# Patient Record
Sex: Female | Born: 1980 | Race: White | Hispanic: Yes | Marital: Married | Smoking: Never smoker
Health system: Southern US, Community
[De-identification: ages and names within clinical notes are randomized; demographics above are authoritative.]

## PROBLEM LIST (undated history)

## (undated) DIAGNOSIS — N649 Disorder of breast, unspecified: Secondary | ICD-10-CM

## (undated) HISTORY — DX: Disorder of breast, unspecified: N64.9

---

## 2002-02-14 ENCOUNTER — Ambulatory Visit (HOSPITAL_COMMUNITY): Admission: RE | Admit: 2002-02-14 | Discharge: 2002-02-14 | Payer: Self-pay | Admitting: *Deleted

## 2002-05-29 ENCOUNTER — Encounter (HOSPITAL_COMMUNITY): Admission: AD | Admit: 2002-05-29 | Discharge: 2002-05-29 | Payer: Self-pay | Admitting: *Deleted

## 2002-05-29 ENCOUNTER — Inpatient Hospital Stay (HOSPITAL_COMMUNITY): Admission: AD | Admit: 2002-05-29 | Discharge: 2002-06-02 | Payer: Self-pay | Admitting: Obstetrics and Gynecology

## 2007-05-06 ENCOUNTER — Ambulatory Visit (HOSPITAL_COMMUNITY): Admission: RE | Admit: 2007-05-06 | Discharge: 2007-05-06 | Payer: Self-pay | Admitting: Obstetrics & Gynecology

## 2007-07-01 ENCOUNTER — Ambulatory Visit (HOSPITAL_COMMUNITY): Admission: RE | Admit: 2007-07-01 | Discharge: 2007-07-01 | Payer: Self-pay | Admitting: Obstetrics & Gynecology

## 2007-08-26 ENCOUNTER — Ambulatory Visit (HOSPITAL_COMMUNITY): Admission: RE | Admit: 2007-08-26 | Discharge: 2007-08-26 | Payer: Self-pay | Admitting: Obstetrics & Gynecology

## 2007-09-09 ENCOUNTER — Inpatient Hospital Stay (HOSPITAL_COMMUNITY): Admission: AD | Admit: 2007-09-09 | Discharge: 2007-09-11 | Payer: Self-pay | Admitting: Family Medicine

## 2007-09-09 ENCOUNTER — Ambulatory Visit: Payer: Self-pay | Admitting: Obstetrics and Gynecology

## 2011-02-10 LAB — CBC
HCT: 28.1 — ABNORMAL LOW
HCT: 35.4 — ABNORMAL LOW
Hemoglobin: 12.2
Hemoglobin: 9.8 — ABNORMAL LOW
MCHC: 34.5
MCHC: 34.7
MCV: 85
MCV: 85.5
Platelets: 268
RBC: 4.14
RDW: 13.9
WBC: 12 — ABNORMAL HIGH

## 2011-02-10 LAB — RPR: RPR Ser Ql: NONREACTIVE

## 2011-10-15 ENCOUNTER — Other Ambulatory Visit: Payer: Self-pay | Admitting: Obstetrics and Gynecology

## 2011-10-15 DIAGNOSIS — N63 Unspecified lump in unspecified breast: Secondary | ICD-10-CM

## 2011-10-16 ENCOUNTER — Encounter: Payer: Self-pay | Admitting: *Deleted

## 2011-10-16 ENCOUNTER — Ambulatory Visit (INDEPENDENT_AMBULATORY_CARE_PROVIDER_SITE_OTHER): Payer: Self-pay | Admitting: *Deleted

## 2011-10-16 VITALS — BP 118/62 | HR 76 | Temp 98.7°F | Ht 61.5 in | Wt 129.9 lb

## 2011-10-16 DIAGNOSIS — N63 Unspecified lump in unspecified breast: Secondary | ICD-10-CM

## 2011-10-16 DIAGNOSIS — Z1239 Encounter for other screening for malignant neoplasm of breast: Secondary | ICD-10-CM

## 2011-10-16 DIAGNOSIS — N6321 Unspecified lump in the left breast, upper outer quadrant: Secondary | ICD-10-CM

## 2011-10-16 NOTE — Patient Instructions (Signed)
Taught patient how to perform BSE. Patient did not need a Pap smear today due to last Pap smear was 07/03/11. Let her know BCCCP will cover Pap smears every 3 years unless has a history of abnormal Pap smears. Patient referred to the [facility] for left breast Diagnostic Mammogram and possible left breast ultrasound. Appointment scheduled for Thursday, October 22, 2011 at 1510. Patient aware of appointment and will be there. Let patient know will follow up with her within the next couple weeks with results. Patient verbalized understanding.

## 2011-10-16 NOTE — Progress Notes (Signed)
Complaints of left breast lump x 1 month. Referred by the [facility] Department 10/07/11.  Pap Smear:    Pap smear not performed today. Patients last Pap smear was 07/03/11 at [facility] Department and was normal with fungal organisms consistent with candida. Per patient she has no history of abnormal Pap smears. No Pap smear results in EPIC.  Physical exam: Breasts Breasts symmetrical. No skin abnormalities bilateral breasts. No nipple retraction bilateral breasts. No nipple discharge bilateral breasts. No lymphadenopathy. No lumps palpated right breast. Palpated lump in the left breast at 1 o'clock proximal to the nipple measuring 2 cm x 2 cm. No complaints of pain or tenderness on exam. Patient referred to the [facility] for left breast Diagnostic Mammogram and possible left breast ultrasound. Appointment scheduled for Thursday, October 22, 2011 at 1510.        Pelvic/Bimanual No Pap smear completed today since last Pap smear was 07/03/11 and normal. Pap smear not indicated per BCCCP guidelines.

## 2011-10-22 ENCOUNTER — Ambulatory Visit
Admission: RE | Admit: 2011-10-22 | Discharge: 2011-10-22 | Disposition: A | Discharge status: Home / Self Care | Payer: No Typology Code available for payment source | Source: Ambulatory Visit | Attending: Obstetrics and Gynecology | Admitting: Obstetrics and Gynecology

## 2011-10-22 ENCOUNTER — Other Ambulatory Visit: Payer: Self-pay | Admitting: Obstetrics and Gynecology

## 2011-10-22 DIAGNOSIS — N63 Unspecified lump in unspecified breast: Secondary | ICD-10-CM

## 2011-11-05 ENCOUNTER — Ambulatory Visit
Admission: RE | Admit: 2011-11-05 | Discharge: 2011-11-05 | Disposition: A | Discharge status: Home / Self Care | Payer: No Typology Code available for payment source | Source: Ambulatory Visit | Attending: Obstetrics and Gynecology | Admitting: Obstetrics and Gynecology

## 2011-11-05 ENCOUNTER — Other Ambulatory Visit: Payer: Self-pay | Admitting: Obstetrics and Gynecology

## 2011-11-05 DIAGNOSIS — N63 Unspecified lump in unspecified breast: Secondary | ICD-10-CM

## 2011-11-06 ENCOUNTER — Ambulatory Visit
Admission: RE | Admit: 2011-11-06 | Discharge: 2011-11-06 | Disposition: A | Discharge status: Home / Self Care | Payer: No Typology Code available for payment source | Source: Ambulatory Visit | Attending: Obstetrics and Gynecology | Admitting: Obstetrics and Gynecology

## 2011-11-06 DIAGNOSIS — N63 Unspecified lump in unspecified breast: Secondary | ICD-10-CM

## 2011-11-06 NOTE — Progress Notes (Signed)
Interpreter Wyvonnia Dusky for Biopsy results Breast Center

## 2014-03-19 ENCOUNTER — Encounter: Payer: Self-pay | Admitting: *Deleted

## 2017-04-19 ENCOUNTER — Encounter (HOSPITAL_COMMUNITY): Payer: Self-pay

## 2020-01-18 ENCOUNTER — Encounter: Payer: Self-pay | Admitting: Obstetrics and Gynecology

## 2020-01-18 ENCOUNTER — Other Ambulatory Visit (HOSPITAL_COMMUNITY)
Admission: RE | Admit: 2020-01-18 | Discharge: 2020-01-18 | Disposition: A | Discharge status: Home / Self Care | Payer: Self-pay | Source: Ambulatory Visit | Attending: Obstetrics and Gynecology | Admitting: Obstetrics and Gynecology

## 2020-01-18 ENCOUNTER — Other Ambulatory Visit: Payer: Self-pay

## 2020-01-18 ENCOUNTER — Ambulatory Visit (INDEPENDENT_AMBULATORY_CARE_PROVIDER_SITE_OTHER): Payer: Self-pay | Admitting: Obstetrics and Gynecology

## 2020-01-18 ENCOUNTER — Ambulatory Visit: Payer: Self-pay | Admitting: *Deleted

## 2020-01-18 VITALS — BP 132/82 | Wt 147.8 lb

## 2020-01-18 DIAGNOSIS — R87610 Atypical squamous cells of undetermined significance on cytologic smear of cervix (ASC-US): Secondary | ICD-10-CM

## 2020-01-18 DIAGNOSIS — Z1239 Encounter for other screening for malignant neoplasm of breast: Secondary | ICD-10-CM

## 2020-01-18 DIAGNOSIS — R87810 Cervical high risk human papillomavirus (HPV) DNA test positive: Secondary | ICD-10-CM | POA: Insufficient documentation

## 2020-01-18 DIAGNOSIS — Z3202 Encounter for pregnancy test, result negative: Secondary | ICD-10-CM

## 2020-01-18 LAB — POCT PREGNANCY, URINE: Preg Test, Ur: NEGATIVE

## 2020-01-18 NOTE — Patient Instructions (Signed)
Explained breast self awareness with Ashley Moody. Patient did not need a Pap smear today due to last Pap smear was 11/22/2019. Explained the colposcopy the recommended follow-up for her abnormal Pap smear. Referred patient to the [facility] for [facility] for a colposcopy. Appointment scheduled Thursday, January 18, 2020 at 0935. Patient aware of appointment and will be there. Let patient know her screening mammogram is due in April 2022 after she turns 40. Told patient to give [place] a call prior to April and we will schedule her mammogram on the mobile unit. Explained to patient that BCCCP will cover the mammogram. [name] verbalized understanding.  Annmargaret Decaprio, Kathaleen Maser, RN 8:45 AM

## 2020-01-18 NOTE — Patient Instructions (Signed)
Colposcopía, cuidados posteriores °Colposcopy, Care After °Lea esta información sobre cómo cuidarse después del procedimiento. El médico también podrá darle indicaciones más específicas. Si tiene problemas o preguntas, llame al médico. °¿Qué puedo esperar después del procedimiento? °Si no le han extraído una muestra de tejido (no le realizaron una biopsia), es posible que solo presente unas manchas durante algunos días. Puede reanudar sus actividades habituales. °Si le extrajeron una muestra de tejido, es frecuente que tenga lo siguiente: °· Sensibilidad y dolor. Esto puede durar algunos días. °· Sensación de desvanecimiento. °· Sangrado leve de la vagina o secreción granulada de color oscuro de la vagina. Esto puede durar algunos días. Es posible que deba usar un apósito sanitario. °· Manchas durante al menos 48 horas después del procedimiento. °Siga estas indicaciones en su casa: ° °· Tome los medicamentos de venta libre y los recetados solamente como se lo haya indicado el médico. Pregúntele al médico qué medicamentos puede comenzar a tomar nuevamente. Esto es muy importante si toma anticoagulantes. °· No conduzca ni use maquinaria pesada mientras toma analgésicos recetados. °· Durante 3 días, o durante el tiempo que le indique el médico, evite lo siguiente: °? Las duchas vaginales. °? Los tampones. °? Las relaciones sexuales. °· Si usa un método anticonceptivo, continúe usándolo. °· Limite la actividad durante el primer día posterior al procedimiento. Pregúntele al médico qué actividades son seguras para usted. °· Es su responsabilidad retirar los resultados del procedimiento. Pregúntele al médico la fecha en que los resultados estarán disponibles. °· Concurra a todas las visitas de control como se lo haya indicado el médico. Esto es importante. °Comuníquese con un médico si: °· Aparece una erupción cutánea. °Solicite ayuda de inmediato si: °· Tiene mucho sangrado de la vagina. Mucho sangrado es si usa más de un  apósito sanitario por hora durante 2 horas seguidas. °· Elimina grumos de sangre (coágulos de sangre) por la vagina. °· Tiene fiebre. °· Tiene escalofríos. °· Tiene dolor en la parte baja del vientre (zona pélvica). °· Tiene signos de infección, como secreción vaginal que: °? Es diferente de la habitual. °? Es de color amarillo. °? Tiene mal olor. °· Tiene mucho dolor o cólicos abdominales en la parte baja del vientre que no se alivian con medicamentos. °· Tiene sensación de desvanecimiento. °· Siente mareos. °· Pierde el conocimiento (se desmaya). °Resumen °· Si no le han extraído una muestra de tejido (no le realizaron una biopsia), es posible que solo presente unas manchas durante algunos días. Puede reanudar sus actividades habituales. °· Si le extrajeron una muestra de tejido, es frecuente tener dolor leve y manchas durante 48 horas. °· Durante 3 días, o durante el tiempo que le indique el médico, evite las duchas vaginales, los tampones y las relaciones sexuales. °· Busque ayuda de inmediato si presenta sangrado, mucho dolor intenso o signos de infección. °Esta información no tiene como fin reemplazar el consejo del médico. Asegúrese de hacerle al médico cualquier pregunta que tenga. °Document Revised: 12/11/2016 Document Reviewed: 12/01/2012 °Elsevier Patient Education © 2020 Elsevier Inc. ° °

## 2020-01-18 NOTE — Progress Notes (Signed)
[name] is a 39 y.o. female who presents to [facility] clinic today with no complaints.    Pap Smear: Pap smear not completed today. Last Pap smear was 11/22/2019 at the [facility] Department clinic and was ASCUS with positive HPV. Per patient her most recent Pap smear is the only abnormal Pap smear she has had. Last Pap smear result is available in Epic.   Physical exam: Breasts Breasts symmetrical. No skin abnormalities bilateral breasts. No nipple retraction bilateral breasts. No nipple discharge bilateral breasts. No lymphadenopathy. No lumps palpated right breast. 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 No complaints of pain or tenderness on exam. Screening mammogram recommended in April 2022 after patient turns 40.       Pelvic/Bimanual Pap is not indicated today per BCCCP guidelines.    Smoking History: Patient has never smoked.   Patient Navigation: Patient education provided. Access to services provided for patient through [place] program. Spanish interpreter Natale Lay from [facility] provided.    Breast and Cervical Cancer Risk Assessment: Patient does not have family history of breast cancer, known genetic mutations, or radiation treatment to the chest before age 4. Patient does not have history of cervical dysplasia, immunocompromised, or DES exposure in-utero.  Risk Assessment    Risk Scores      01/18/2020   Last edited by: Meryl Dare, CMA   5-year risk: 0.6 %   Lifetime risk: 9 %          A: BCCCP exam without pap smear Patient referred to [facility] by the [facility] Department due to having an abnormal Pap smear on 11/22/2019 and a colposcopy is recommended for follow-up.   P: Referred patient to the [facility] for [facility] for a colposcopy.  Appointment scheduled Thursday, January 18, 2020 at 0935.  Priscille Heidelberg, RN 01/18/2020 8:44 AM

## 2020-01-18 NOTE — Progress Notes (Signed)
    GYNECOLOGY CLINIC COLPOSCOPY PROCEDURE NOTE  39 y.o. [id number] here for colposcopy for ASCUS with POSITIVE high risk HPV pap smear on 11/22/19. Discussed role for HPV in cervical dysplasia, need for surveillance.  Patient given informed consent, signed copy in the chart, time out was performed.  Placed in lithotomy position. Cervix viewed with speculum and colposcope after application of acetic acid.   Colposcopy adequate? Yes  acetowhite lesion(s) noted at 12 & 6 o'clock; corresponding biopsies obtained.  ECC specimen obtained. Monsel's applied All specimens were labelled and sent to pathology.   Patient was given post procedure instructions.  Will follow up pathology and manage accordingly.  Routine preventative health maintenance measures emphasized.   Nettie Elm, MD, FACOG Attending Obstetrician & Gynecologist Center for [facility], [facility], [facility]

## 2020-01-23 LAB — SURGICAL PATHOLOGY

## 2020-01-25 ENCOUNTER — Encounter: Payer: Self-pay | Admitting: General Practice

## 2021-01-06 ENCOUNTER — Other Ambulatory Visit: Payer: Self-pay

## 2021-01-06 DIAGNOSIS — Z1231 Encounter for screening mammogram for malignant neoplasm of breast: Secondary | ICD-10-CM

## 2021-01-21 ENCOUNTER — Other Ambulatory Visit: Payer: Self-pay | Admitting: Obstetrics and Gynecology

## 2021-01-21 DIAGNOSIS — Z1231 Encounter for screening mammogram for malignant neoplasm of breast: Secondary | ICD-10-CM

## 2021-02-20 ENCOUNTER — Ambulatory Visit
Admission: RE | Admit: 2021-02-20 | Discharge: 2021-02-20 | Disposition: A | Discharge status: Home / Self Care | Payer: No Typology Code available for payment source | Source: Ambulatory Visit | Attending: Obstetrics and Gynecology | Admitting: Obstetrics and Gynecology

## 2021-02-20 ENCOUNTER — Ambulatory Visit: Payer: Self-pay | Admitting: *Deleted

## 2021-02-20 ENCOUNTER — Other Ambulatory Visit: Payer: Self-pay

## 2021-02-20 ENCOUNTER — Ambulatory Visit: Payer: Self-pay

## 2021-02-20 VITALS — BP 106/70 | Wt 131.1 lb

## 2021-02-20 DIAGNOSIS — Z01419 Encounter for gynecological examination (general) (routine) without abnormal findings: Secondary | ICD-10-CM

## 2021-02-20 NOTE — Patient Instructions (Signed)
Explained breast self awareness with Ashley Moody. Pap smear completed today. Let patient know that if today's Pap smear is normal and HPV negative that her next Pap smear is due in one year. Referred patient to the [facility] for a screening mammogram on mobile unit. Appointment scheduled Thursday, February 20, 2021 at 1140. Patient escorted to the mobile unit following BCCCP appointment for her screening mammogram. Let patient know will follow up with her within the next couple weeks with results of Pap smear by phone. Informed patient that the Breast Center will follow-up with her within the next couple of weeks with results of her mammogram by letter or phone. Ashley Moody verbalized understanding.  Willy Pinkerton, Kathaleen Maser, RN 11:27 AM

## 2021-02-20 NOTE — Progress Notes (Signed)
[name] is a 40 y.o. [id number] female who presents to [facility] clinic today with complaint of AUB. Patient states her menstrual periods last 3-4 weeks every 3 months. Patient had a Nexplanon placed a year ago. Offered to refer to the [facility] for [facility]. Let her know that BCCCP will not cover the follow-up appointment. Patient given the [facility] Assistance Application. Patient stated she has an appointment at the [facility] Department and that is where she had it placed. Patient stated she will talk with them about her AUB and call us if she needs a referral.    Pap Smear: Pap smear completed today. Last Pap smear was 11/22/2019 at the [facility] Department clinic and was ASCUS with positive HPV. 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 Per patient her most recent Pap smear is the only abnormal Pap smear she has had. Last Pap smear and colposcopy result is available in Epic.   Physical exam: Breasts Breasts symmetrical. No skin abnormalities bilateral breasts. No nipple retraction bilateral breasts. No nipple discharge bilateral breasts. No lymphadenopathy. No lumps palpated bilateral breasts. No complaints of pain or tenderness on exam.     Pelvic/Bimanual Ext Genitalia No lesions, no swelling and no discharge observed on external genitalia.        Vagina Vagina pink and normal texture. No lesions or discharge observed in vagina.        Cervix Cervix is present. Cervix pink and of normal texture. Blood observed at cervical os. Patient complained of AUB.    Uterus Uterus is present and palpable. Uterus in normal position and normal size.        Adnexae Bilateral ovaries present and palpable. No tenderness on palpation.         Rectovaginal No rectal exam completed today since patient had no rectal complaints. No skin abnormalities observed on exam.     Smoking  History: Patient has never smoked.   Patient Navigation: Patient education provided. Access to services provided for patient through [place] program. Spanish interpreter Natale Lay from [facility] provided.    Breast and Cervical Cancer Risk Assessment: Patient does not have family history of breast cancer, known genetic mutations, or radiation treatment to the chest before age 42. Patient has history of cervical dysplasia. Patient has no history of being immunocompromised or DES exposure in-utero.  Risk Assessment     Risk Scores       02/20/2021 01/18/2020   Last edited by: Narda Rutherford, LPN Meryl Dare, CMA   5-year risk: 0.4 % 0.6 %   Lifetime risk: 6.3 % 9 %            A: BCCCP exam with pap smear No complaints.  P: Referred patient to the [facility] for a screening mammogram on mobile unit. Appointment scheduled Thursday, February 20, 2021 at 1140.  Priscille Heidelberg, RN 02/20/2021 11:27 AM

## 2021-02-25 ENCOUNTER — Telehealth: Payer: Self-pay

## 2021-02-25 LAB — CYTOLOGY - PAP
Comment: NEGATIVE
Diagnosis: NEGATIVE
High risk HPV: NEGATIVE

## 2021-02-25 NOTE — Telephone Encounter (Addendum)
Via Erika McReynolds, Spanish Interpreter ([employer]), Patient informed negative Pap/HPV results, next pap due in 1 year. Patient verbalized understanding.  

## 2021-12-17 ENCOUNTER — Other Ambulatory Visit: Payer: Self-pay

## 2021-12-17 DIAGNOSIS — Z1231 Encounter for screening mammogram for malignant neoplasm of breast: Secondary | ICD-10-CM

## 2022-02-24 ENCOUNTER — Ambulatory Visit: Payer: Self-pay | Admitting: *Deleted

## 2022-02-24 ENCOUNTER — Ambulatory Visit
Admission: RE | Admit: 2022-02-24 | Discharge: 2022-02-24 | Disposition: A | Discharge status: Home / Self Care | Payer: No Typology Code available for payment source | Source: Ambulatory Visit | Attending: Obstetrics and Gynecology | Admitting: Obstetrics and Gynecology

## 2022-02-24 VITALS — BP 100/70 | Wt 143.1 lb

## 2022-02-24 DIAGNOSIS — Z1231 Encounter for screening mammogram for malignant neoplasm of breast: Secondary | ICD-10-CM

## 2022-02-24 DIAGNOSIS — Z01419 Encounter for gynecological examination (general) (routine) without abnormal findings: Secondary | ICD-10-CM

## 2022-02-24 NOTE — Patient Instructions (Signed)
Explained breast self awareness with [name]. Pap smear completed today. Let her know that if today's Pap smear is normal and HPV negative that her next Pap smear is due in year due to her history of an abnormal Pap smear. Referred patient to the [facility] for a screening mammogram on mobile unit. Appointment scheduled Tuesday, February 24, 2022 at 1430. Patient aware of appointment and will be there. Let patient know will follow up with her within the next couple weeks with results of Pap smear by phone. Informed patient that the Breast Center will follow up with her within the next couple of weeks with results of her mammogram by letter or phone. Ashley Moody verbalized understanding.  Smaran Gaus, Arvil Chaco, RN 2:00 PM

## 2022-02-24 NOTE — Progress Notes (Signed)
[name] is a 41 y.o. [id number] female who presents to [facility] clinic today with no complaints.    Pap Smear: Pap smear completed today. Last Pap smear was 02/20/2021 at [facility] and normal with negative HPV. Patient has a history of an abnormal Pap smear 11/22/2019 at the [facility] Department clinic that was ASCUS with positive HPV. 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 Last two Pap smear and colposcopy results are available in Epic.   Physical exam: Breasts Breasts symmetrical. No skin abnormalities bilateral breasts. No nipple retraction bilateral breasts. No nipple discharge bilateral breasts. No lymphadenopathy. No lumps palpated right breast. 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 Patient denied any changes to the size of the lump. No complaints of pain or tenderness on exam.   MM 3D SCREEN BREAST BILATERAL  Result Date: 02/25/2021 CLINICAL DATA:  Screening. EXAM: DIGITAL SCREENING BILATERAL MAMMOGRAM WITH TOMOSYNTHESIS AND CAD TECHNIQUE: Bilateral screening digital craniocaudal and mediolateral oblique mammograms were obtained. Bilateral screening digital breast tomosynthesis was performed. The images were evaluated with computer-aided detection. COMPARISON:  Previous exam(s). ACR Breast Density Category c: The breast tissue is heterogeneously dense, which may obscure small masses. FINDINGS: There are no findings suspicious for malignancy. IMPRESSION: No mammographic evidence of malignancy. A result letter of this screening mammogram will be mailed directly to the patient. RECOMMENDATION: Screening mammogram in one year. (Code:SM-B-01Y) BI-RADS CATEGORY  1: Negative. Electronically Signed   By: Lajean Manes M.D.   On: 02/25/2021 13:55       Pelvic/Bimanual Ext Genitalia No lesions, no swelling and no discharge observed on external genitalia.        Vagina Vagina pink and normal texture. No lesions or discharge observed in vagina.        Cervix Cervix is present. Cervix pink and of normal texture. No discharge observed.    Uterus Uterus is present and palpable. Uterus in normal position and normal size.        Adnexae Bilateral ovaries present and palpable. No tenderness on palpation.         Rectovaginal No rectal exam completed today since patient had no rectal complaints. No skin abnormalities observed on exam.     Smoking History: Patient has never smoked.   Patient Navigation: Patient education provided. Access to services provided for patient through [facility] program. Spanish interpreter Geraldo Pitter from [facility] provided.    Breast and Cervical Cancer Risk Assessment: Patient does not have family history of breast cancer, known genetic mutations, or radiation treatment to the chest before age 41. Patient has history of cervical dysplasia. Patient has no history of being immunocompromised or DES exposure in-utero.  Risk Assessment     Risk Scores       02/24/2022 02/20/2021   Last edited by: Royston Bake, CMA [place], Garvin Fila, RT   5-year risk: 0.5 % 0.4 %   Lifetime risk: 6.3 % 6.3 %            A: BCCCP exam with pap smear No complaints.  P: Referred patient to the [facility] for a screening mammogram on mobile unit. Appointment scheduled Tuesday, February 24, 2022 at 1430.  Loletta Parish, RN 02/24/2022 4:22 PM    Rich Brave Heath Gold, RN 02/24/2022 2:00  PM

## 2022-02-26 LAB — CYTOLOGY - PAP
Comment: NEGATIVE
Diagnosis: NEGATIVE
High risk HPV: NEGATIVE

## 2022-03-05 ENCOUNTER — Telehealth: Payer: Self-pay

## 2022-03-05 NOTE — Telephone Encounter (Signed)
Via Lavon Paganini, Spanish Interpreter [facility]), Patient informed negative Pap/HPV results, next pap due in 1 year, due to previous abnormal results. Patient verbalized understanding.

## 2022-12-03 ENCOUNTER — Other Ambulatory Visit: Payer: Self-pay | Admitting: Obstetrics & Gynecology

## 2022-12-03 DIAGNOSIS — Z1231 Encounter for screening mammogram for malignant neoplasm of breast: Secondary | ICD-10-CM

## 2023-02-02 ENCOUNTER — Other Ambulatory Visit: Payer: Self-pay

## 2023-02-02 DIAGNOSIS — Z1231 Encounter for screening mammogram for malignant neoplasm of breast: Secondary | ICD-10-CM

## 2023-04-01 ENCOUNTER — Ambulatory Visit
Admission: RE | Admit: 2023-04-01 | Discharge: 2023-04-01 | Disposition: A | Discharge status: Home / Self Care | Payer: No Typology Code available for payment source | Source: Ambulatory Visit | Attending: Obstetrics and Gynecology | Admitting: Obstetrics and Gynecology

## 2023-04-01 ENCOUNTER — Ambulatory Visit: Payer: Self-pay | Admitting: Hematology and Oncology

## 2023-04-01 VITALS — BP 122/70 | Wt 146.0 lb

## 2023-04-01 DIAGNOSIS — Z1231 Encounter for screening mammogram for malignant neoplasm of breast: Secondary | ICD-10-CM

## 2023-04-01 DIAGNOSIS — Z01419 Encounter for gynecological examination (general) (routine) without abnormal findings: Secondary | ICD-10-CM

## 2023-04-01 NOTE — Progress Notes (Signed)
Ashley Moody is a 42 y.o. G61P2002 female who presents to [facility] clinic today with no complaints.    Pap Smear: Pap smear completed today. Last Pap smear was 02/24/2022 and was normal. Per patient has no history of an abnormal Pap smear. Last Pap smear result is available in Epic. 02/24/2022 - Normal/ HPV-;02/20/2021 - Normal/ HPV-; 11/27/2019 - ASCUS/ HPV+   Physical exam: Breasts Breasts symmetrical. No skin abnormalities bilateral breasts. No nipple retraction bilateral breasts. No nipple discharge bilateral breasts. No lymphadenopathy. No lumps palpated bilateral breasts.   MS DIGITAL SCREENING TOMO BILATERAL  Result Date: 02/25/2022 CLINICAL DATA:  Screening. EXAM: DIGITAL SCREENING BILATERAL MAMMOGRAM WITH TOMOSYNTHESIS AND CAD TECHNIQUE: Bilateral screening digital craniocaudal and mediolateral oblique mammograms were obtained. Bilateral screening digital breast tomosynthesis was performed. The images were evaluated with computer-aided detection. COMPARISON:  Previous exam(s). ACR Breast Density Category d: The breast tissue is extremely dense, which lowers the sensitivity of mammography FINDINGS: There are no findings suspicious for malignancy. IMPRESSION: No mammographic evidence of malignancy. A result letter of this screening mammogram will be mailed directly to the patient. RECOMMENDATION: Screening mammogram in one year. (Code:SM-B-01Y) BI-RADS CATEGORY  1: Negative. Electronically Signed   By: Frederico Hamman M.D.   On: 02/25/2022 11:43   MM 3D SCREEN BREAST BILATERAL  Result Date: 02/25/2021 CLINICAL DATA:  Screening. EXAM: DIGITAL SCREENING BILATERAL MAMMOGRAM WITH TOMOSYNTHESIS AND CAD TECHNIQUE: Bilateral screening digital craniocaudal and mediolateral oblique mammograms were obtained. Bilateral screening digital breast tomosynthesis was performed. The images were evaluated with computer-aided detection. COMPARISON:  Previous exam(s). ACR Breast Density Category c: The  breast tissue is heterogeneously dense, which may obscure small masses. FINDINGS: There are no findings suspicious for malignancy. IMPRESSION: No mammographic evidence of malignancy. A result letter of this screening mammogram will be mailed directly to the patient. RECOMMENDATION: Screening mammogram in one year. (Code:SM-B-01Y) BI-RADS CATEGORY  1: Negative. Electronically Signed   By: Amie Portland M.D.   On: 02/25/2021 13:55        Pelvic/Bimanual Ext Genitalia No lesions, no swelling and no discharge observed on external genitalia.        Vagina Vagina pink and normal texture. No lesions or discharge observed in vagina.        Cervix Cervix is present. Cervix pink and of normal texture. No discharge observed.    Uterus Uterus is present and palpable. Uterus in normal position and normal size.        Adnexae Bilateral ovaries present and palpable. No tenderness on palpation.         Rectovaginal No rectal exam completed today since patient had no rectal complaints. No skin abnormalities observed on exam.     Smoking History: Patient has never smoked and was not referred to quit line.    Patient Navigation: Patient education provided. Access to services provided for patient through BCCCP program. Natale Lay interpreter provided. No transportation provided   Colorectal Cancer Screening: Per patient has never had colonoscopy completed No complaints today.    Breast and Cervical Cancer Risk Assessment: Patient does not have family history of breast cancer, known genetic mutations, or radiation treatment to the chest before age 31. Patient does not have history of cervical dysplasia, immunocompromised, or DES exposure in-utero.     A: BCCCP exam with pap smear No complaints with benign exam.   P: Referred patient to the [facility] for a screening mammogram. Appointment scheduled 04/01/2023.  Pascal Lux, NP 04/01/2023 2:36  PM

## 2023-04-01 NOTE — Patient Instructions (Signed)
Taught Ashley Moody about self breast awareness and gave educational materials to take home. Patient did not need a Pap smear today due to last Pap smear was in 02/24/2022 per patient. Let her know BCCCP will cover Pap smears every 5 years unless has a history of abnormal Pap smears. Referred patient to the [facility] for screening mammogram. Appointment scheduled for 04/01/2023. Patient aware of appointment and will be there. Let patient know will follow up with her within the next couple weeks with results. Ashley Moody verbalized understanding.  Pascal Lux, NP 3:02 PM

## 2023-04-02 LAB — CYTOLOGY - PAP
Comment: NEGATIVE
Diagnosis: NEGATIVE
High risk HPV: NEGATIVE

## 2023-07-12 IMAGING — MG MM DIGITAL SCREENING BILAT W/ TOMO AND CAD
8 series · 9 of 24 positions shown · non-contrast
Comparison: Previous exam(s).

CLINICAL DATA: Screening.

EXAM:
DIGITAL SCREENING BILATERAL MAMMOGRAM WITH TOMOSYNTHESIS AND CAD
TECHNIQUE: Bilateral screening digital craniocaudal and mediolateral oblique
mammograms were obtained. Bilateral screening digital breast
tomosynthesis was performed. The images were evaluated with
computer-aided detection.

[R CC synth-2D]
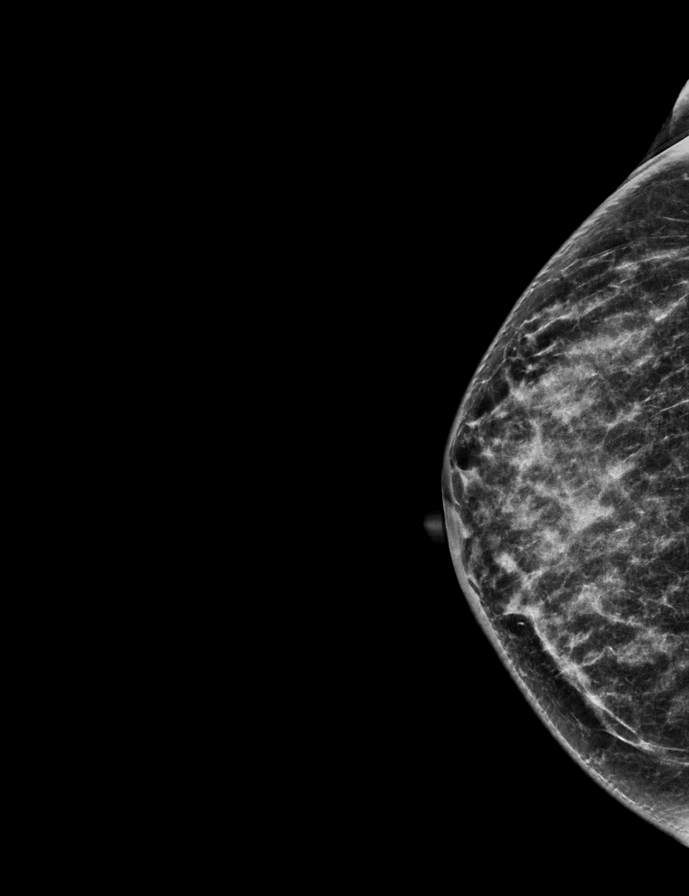

[R MLO synth-2D]
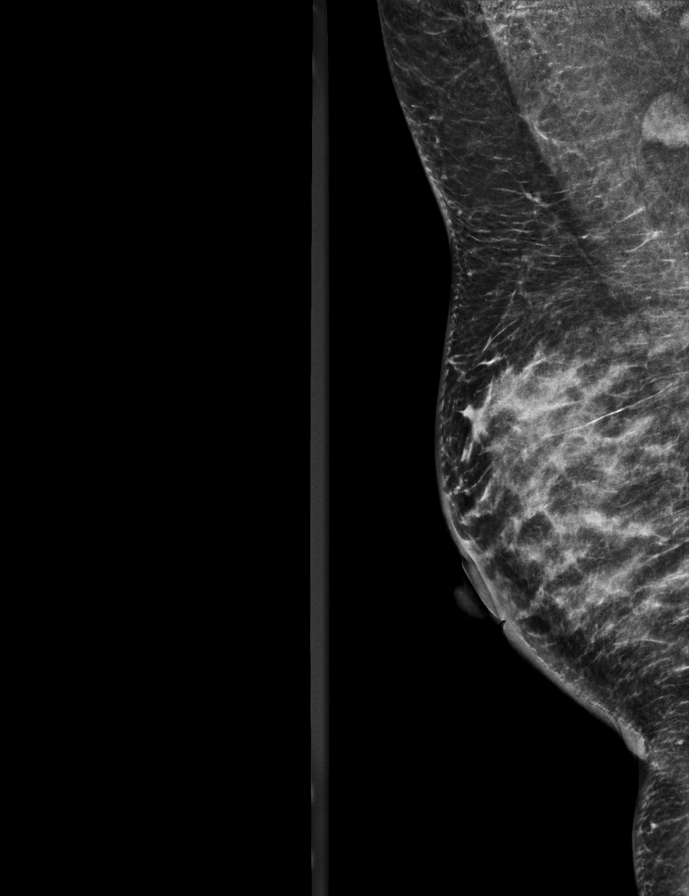

[L CC synth-2D]
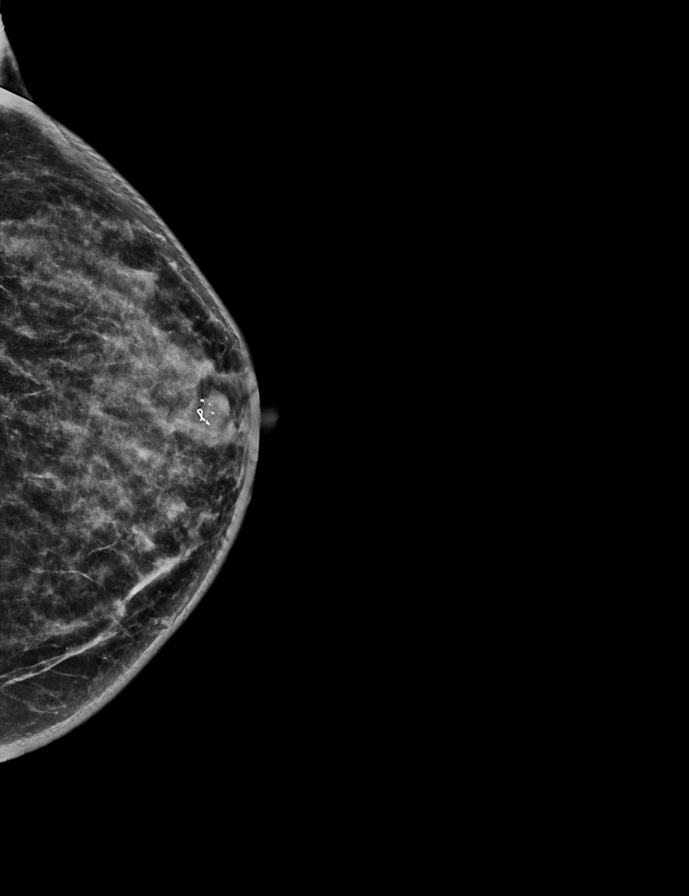

[L MLO synth-2D]
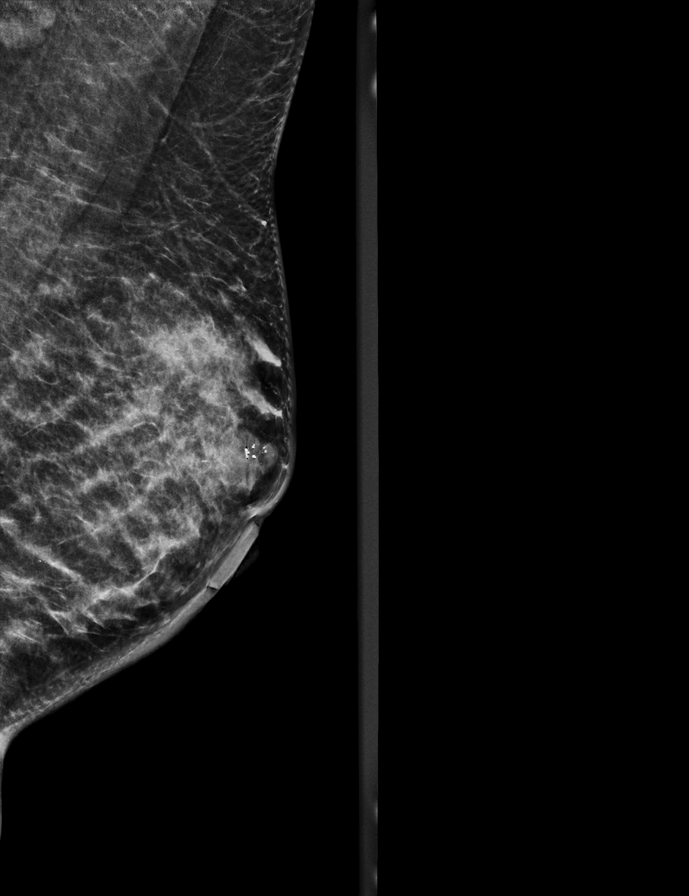

[L CC tomo · 2 of 56 frames shown]
[frame 19/56]
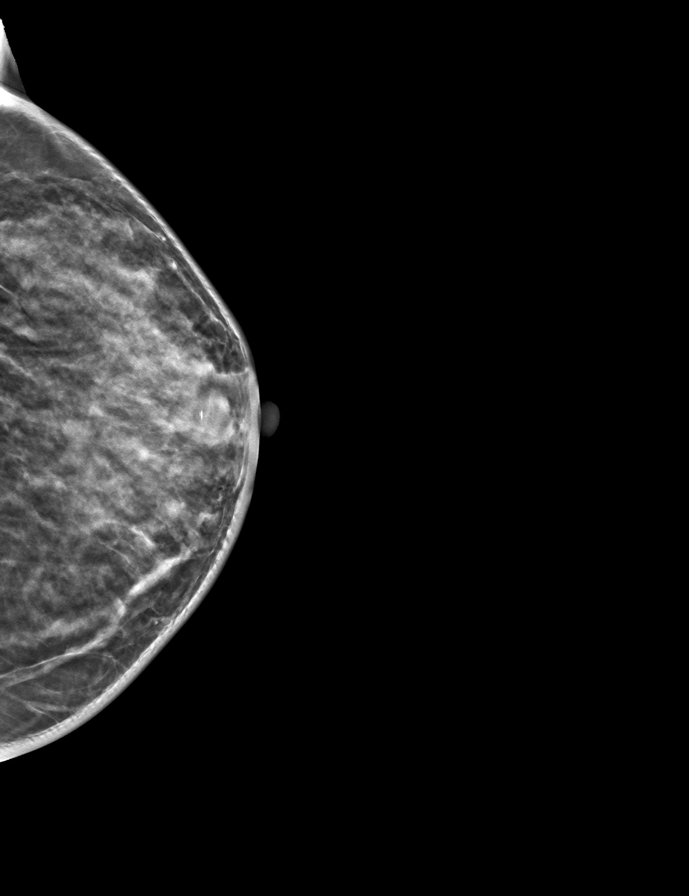
[frame 29/56]
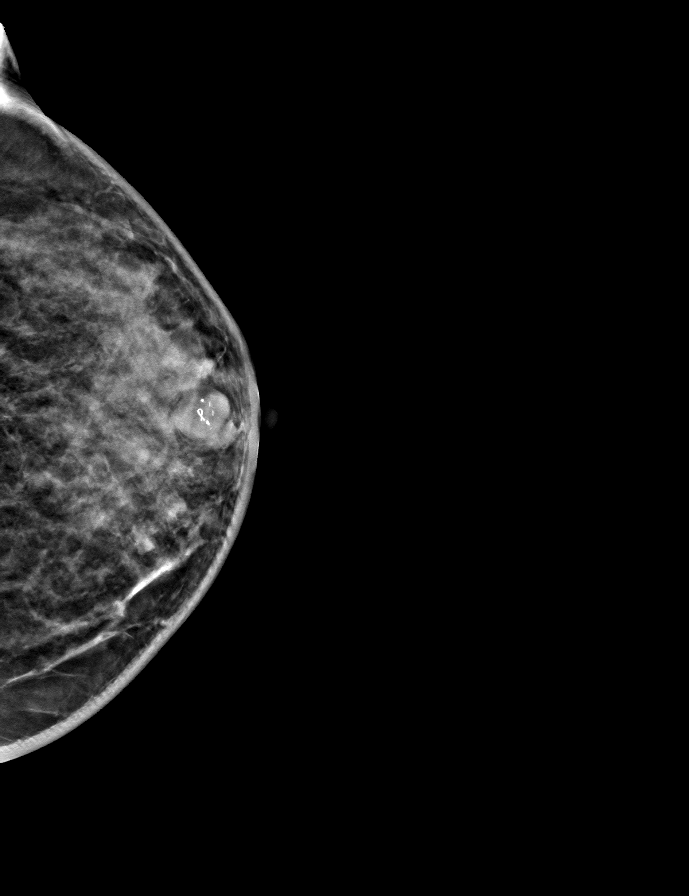

[R MLO tomo · tomo slice 31/60.0]
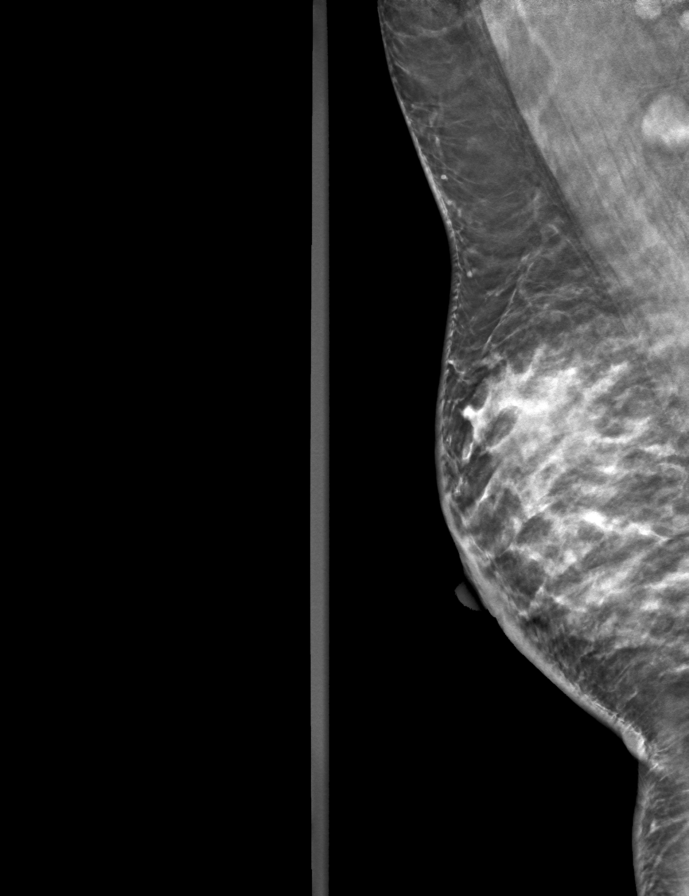

[R CC tomo · tomo slice 27/54.0]
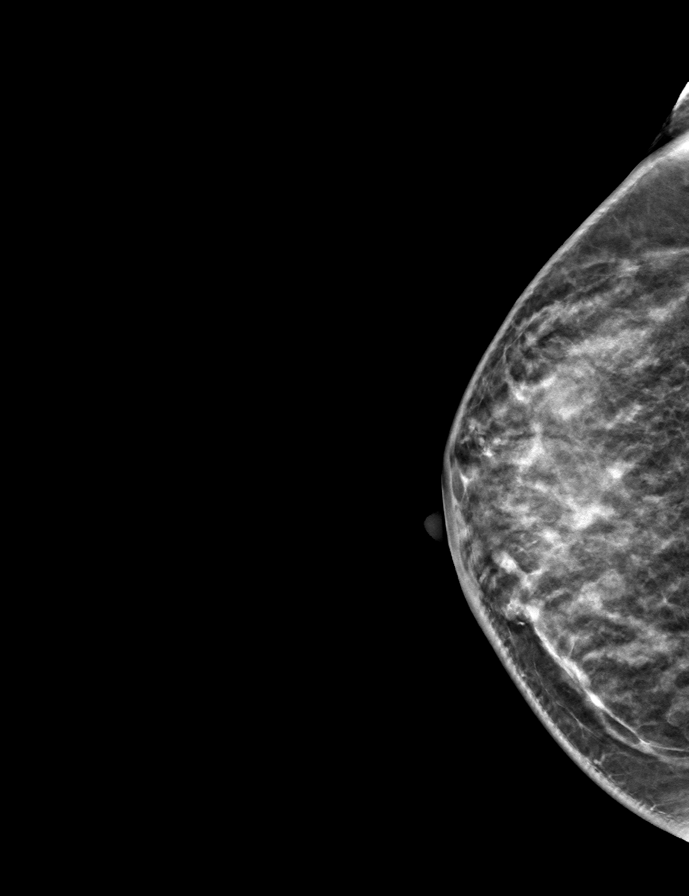

[L MLO tomo · tomo slice 29/57.0]
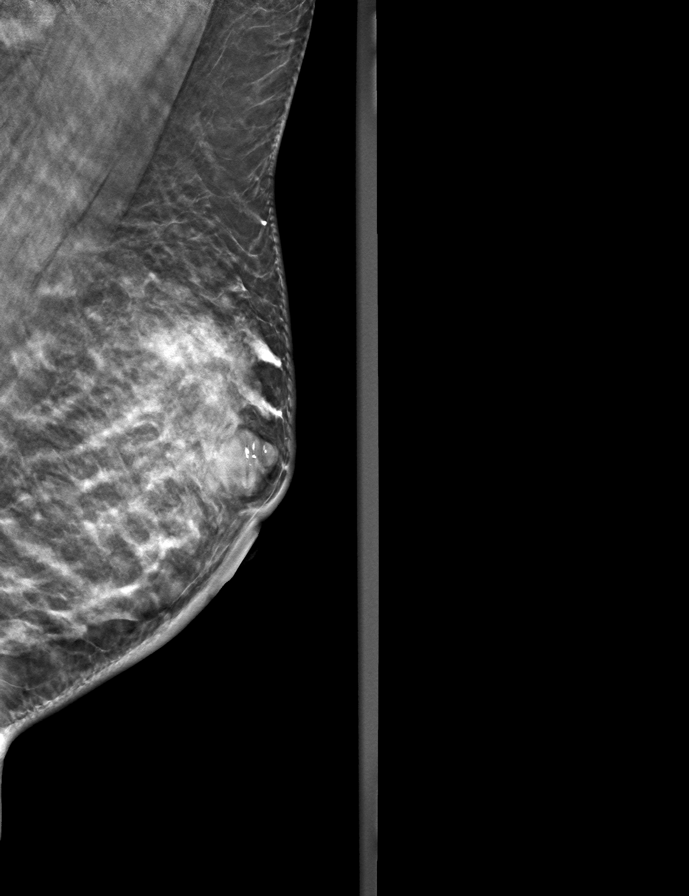

[9 of 24 positions shown; findings below may reference images not displayed]

ACR Breast Density Category c: The breast tissue is heterogeneously
dense, which may obscure small masses.
FINDINGS: There are no findings suspicious for malignancy.
IMPRESSION: No mammographic evidence of malignancy. A result letter of this
screening mammogram will be mailed directly to the patient.

RECOMMENDATION:
Screening mammogram in one year. (Code:Q3-W-BC3)

BI-RADS CATEGORY  1: Negative.

## 2023-10-01 ENCOUNTER — Ambulatory Visit: Payer: Worker's Compensation | Attending: Family Medicine

## 2023-10-01 ENCOUNTER — Encounter: Payer: Self-pay | Admitting: Emergency Medicine

## 2023-10-01 ENCOUNTER — Ambulatory Visit
Admission: EM | Admit: 2023-10-01 | Discharge: 2023-10-01 | Disposition: A | Discharge status: Home / Self Care | Payer: Worker's Compensation

## 2023-10-01 DIAGNOSIS — Z026 Encounter for examination for insurance purposes: Secondary | ICD-10-CM | POA: Diagnosis not present

## 2023-10-01 DIAGNOSIS — S66911A Strain of unspecified muscle, fascia and tendon at wrist and hand level, right hand, initial encounter: Secondary | ICD-10-CM

## 2023-10-01 DIAGNOSIS — S161XXA Strain of muscle, fascia and tendon at neck level, initial encounter: Secondary | ICD-10-CM

## 2023-10-01 DIAGNOSIS — S0990XA Unspecified injury of head, initial encounter: Secondary | ICD-10-CM

## 2023-10-01 MED ORDER — MUPIROCIN 2 % EX OINT: 1.0000 | TOPICAL_OINTMENT | Freq: Two times a day (BID) | CUTANEOUS | 0 refills | Status: AC | PRN

## 2023-10-01 MED ORDER — NAPROXEN 375 MG PO TABS: 375.0000 mg | ORAL_TABLET | Freq: Two times a day (BID) | ORAL | 0 refills | Status: AC

## 2023-10-01 NOTE — Discharge Instructions (Addendum)
 Go to [facility] on Monday for medical clearance to return back work. X-ray imaging of the neck and right wrist were negative for signs of injury or fracture. Take naproxen for pain related to fall, take 2 times daily as needed. Abrasion on right hand, apply mupirocin ointment twice daily as needed

## 2023-10-01 NOTE — ED Provider Notes (Signed)
 [facility]    CSN: [id number] Arrival date & time: 10/01/23  1637      History   Chief Complaint Chief Complaint  Patient presents with   Fall    HPI Ashley Moody is a 43 y.o. female.   Worker's Compensation related encounter.  Place of employment per patient is [employer]. Patient has notified management of work-related incident and injury sustained from an incident that advised her to come here to urgent care for evaluation. 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  She has not taken any medication today for pain.  She denies any loss of consciousness.  Since today's progress she has also had some mild pain with movement of her neck.  She also has a small abrasion on the dorsum surface of the right hand.  Denies any previous head or back injuries.  Here for evaluation..   There is no immunization history on file for this patient.   Past Medical History:  Diagnosis Date   Breast disorder    lump in left breast    Patient Active Problem List   Diagnosis Date Noted   ASCUS with positive high risk HPV cervical 01/18/2020    History reviewed. No pertinent surgical history.  OB History     Gravida  2   Para  2   Term  2   Preterm      AB      Living  2      SAB      IAB      Ectopic      Multiple      Live Births               Home Medications    Prior to Admission medications   Medication Sig Start Date End Date Taking? Authorizing Provider  mupirocin  ointment (BACTROBAN ) 2 % Apply 1 Application topically 2 (two) times daily as needed (hand wound). 10/01/23  Yes Buena Carmine, NP  naproxen  (NAPROSYN ) 375 MG tablet Take 1 tablet (375 mg total) by mouth 2 (two) times  daily with a meal. 10/01/23  Yes Buena Carmine, NP  ciprofloxacin-dexamethasone (CIPRODEX) OTIC suspension INSTILL 4 DROPS INTO AFFECTED EAR(S) TWICE DAILY FOR 7 DAYS Patient not taking: Reported on 10/01/2023 07/16/23   [provider]  fluconazole (DIFLUCAN) 150 MG tablet Take 150 mg by mouth once. Patient not taking: Reported on 10/01/2023 07/16/23   [provider]    Family History Family History  Problem Relation Age of Onset   Breast cancer Neg Hx     Social History Social History   Tobacco Use   Smoking status: Never   Smokeless tobacco: Never  Vaping Use   Vaping status: Never Used  Substance Use Topics   Alcohol use: Never   Drug use: Never     Allergies   Patient has no known allergies.   Review of Systems Review of Systems Pertinent negatives listed in HPI   Physical Exam Triage Vital Signs ED Triage Vitals [10/01/23 1744]  Encounter Vitals Group     BP 124/85     Systolic BP Percentile      Diastolic BP Percentile      Pulse Rate 70  Resp 16     Temp 98.1 F (36.7 C)     Temp Source Oral     SpO2 98 %     Weight      Height      Head Circumference      Peak Flow      Pain Score 5     Pain Loc      Pain Education      Exclude from Growth Chart    No data found.  Updated Vital Signs BP 124/85 (BP Location: Left Arm)   Pulse 70   Temp 98.1 F (36.7 C) (Oral)   Resp 16   LMP 08/13/2023 (Approximate)   SpO2 98%   Visual Acuity Right Eye Distance:   Left Eye Distance:   Bilateral Distance:    Right Eye Near:   Left Eye Near:    Bilateral Near:     Physical Exam Vitals reviewed.  Constitutional:      Appearance: Normal appearance.  HENT:     Head: Normocephalic. Contusion (right lateral scalp-no active bleeding or open wound) present.  Eyes:     Extraocular Movements: Extraocular movements intact.     Pupils: Pupils are equal, round, and reactive to light.  Cardiovascular:     Rate and Rhythm: Normal  rate and regular rhythm.  Pulmonary:     Effort: Pulmonary effort is normal.     Breath sounds: Normal breath sounds.  Musculoskeletal:     Right hand: No swelling or bony tenderness. Normal range of motion.     Left hand: Normal. Normal range of motion.     Cervical back: Normal range of motion and neck supple. Pain with movement present. No muscular tenderness.  Skin:    Findings: Abrasion (palmer right hand) present.  Neurological:     General: No focal deficit present.     Mental Status: She is alert.      UC Treatments / Results  Labs (all labs ordered are listed, but only abnormal results are displayed) Labs Reviewed - No data to display  EKG   Radiology DG Cervical Spine With Flex & Extend Result Date: 10/01/2023 CLINICAL DATA:  Fall EXAM: CERVICAL SPINE COMPLETE WITH FLEXION AND EXTENSION VIEWS COMPARISON:  None Available. FINDINGS: Alignment is anatomic with flexion and extension. No fracture identified. Disc spaces are maintained. No prevertebral soft tissue swelling. IMPRESSION: Negative cervical spine radiographs. Electronically Signed   By: Tyron Gallon M.D.   On: 10/01/2023 19:20   DG Hand 2 View Right Result Date: 10/01/2023 CLINICAL DATA:  Fall EXAM: RIGHT HAND - 2 VIEW COMPARISON:  None Available. FINDINGS: There is no evidence of fracture or dislocation. There is no evidence of arthropathy or other focal bone abnormality. Soft tissues are unremarkable. IMPRESSION: Negative. Electronically Signed   By: Tyron Gallon M.D.   On: 10/01/2023 19:19    Procedures Procedures (including critical care time)  Medications Ordered in UC Medications - No data to display  Initial Impression / Assessment and Plan / UC Course  I have reviewed the triage vital signs and the nursing notes.  Pertinent labs & imaging results that were available during my care of the patient were reviewed by me and considered in my medical decision making (see chart for details).     Encounter for [employer] related injury which has resulted in a minor head injury, neck strain and hand strain.  Imaging of the neck and hand are unremarkable for fracture or dislocation.  Discussed with patient that her pain is likely related to the impact from the fall and pain related to these types of minor injuries are treated with anti-inflammatories.  Patient also has a small abrasion to the right palmar surface of her hand which I recommended a topical antibiotic ointment.  Patient endorses that she is up-to-date on all of her vaccinations.  patient has been advised that she will need to follow-up at employee health and wellness on Monday in order to be completely cleared to return back to work.  Patient has been given of note that covers her for her absence today from work and it is indicated on the note that clearance by employee health and wellness is required prior to return back to work.  See discharge medication orders for treatment plan. Patient verbalized understanding and agreement with plan. Final Clinical Impressions(s) / UC Diagnoses   Final diagnoses:  Encounter related to worker's compensation claim  Injury of head, initial encounter  Neck strain, initial encounter  Hand strain, right, initial encounter     Discharge Instructions      Go to [facility] on Monday for medical clearance to return back work. X-ray imaging of the neck and right wrist were negative for signs of injury or fracture. Take naproxen  for pain related to fall, take 2 times daily as needed. Abrasion on right hand, apply mupirocin  ointment twice daily as needed   ED Prescriptions     Medication Sig Dispense Auth. Provider   naproxen  (NAPROSYN ) 375 MG tablet Take 1 tablet (375 mg total) by mouth 2 (two) times daily with a meal. 20 tablet Buena Carmine, NP   mupirocin  ointment (BACTROBAN ) 2 % Apply 1 Application topically 2 (two) times daily as needed (hand wound).  30 g Buena Carmine, NP      PDMP not reviewed this encounter.   Buena Carmine, NP 10/03/23 1807

## 2023-10-01 NOTE — ED Triage Notes (Signed)
 Pt reports a fall at work this morning in which she injured her back, head, and R hand. Pt notes she was walking into cooler area with a tray when she slipped and fell backwards, slightly to R side. No swelling noted to areas. No LOC just took her a moment to get up due to pain. Can still move through regular ROM but with increased pain to R hand, neck, and back. No dizziness, confusion, lightheadedness, headaches, or drowsiness since incident. Pt states employer contacted WC and said she could be seen at urgent care. No med use since injuries occurred.
